# Patient Record
Sex: Female | Born: 1970 | Race: White | Hispanic: No | Marital: Married | Smoking: Never smoker
Health system: Southern US, Community
[De-identification: ages and names within clinical notes are randomized; demographics above are authoritative.]

## PROBLEM LIST (undated history)

## (undated) DIAGNOSIS — K219 Gastro-esophageal reflux disease without esophagitis: Secondary | ICD-10-CM

## (undated) DIAGNOSIS — I10 Essential (primary) hypertension: Secondary | ICD-10-CM

## (undated) HISTORY — PX: BACK SURGERY: SHX140

---

## 2020-12-22 ENCOUNTER — Encounter (HOSPITAL_COMMUNITY): Payer: Self-pay | Admitting: Emergency Medicine

## 2020-12-22 ENCOUNTER — Emergency Department (HOSPITAL_COMMUNITY): Payer: Self-pay

## 2020-12-22 ENCOUNTER — Other Ambulatory Visit: Payer: Self-pay

## 2020-12-22 ENCOUNTER — Emergency Department (HOSPITAL_COMMUNITY)
Admission: EM | Admit: 2020-12-22 | Discharge: 2020-12-22 | Disposition: A | Discharge status: Home / Self Care | Payer: Self-pay | Attending: Emergency Medicine | Admitting: Emergency Medicine

## 2020-12-22 DIAGNOSIS — K219 Gastro-esophageal reflux disease without esophagitis: Secondary | ICD-10-CM | POA: Insufficient documentation

## 2020-12-22 DIAGNOSIS — Z79899 Other long term (current) drug therapy: Secondary | ICD-10-CM | POA: Insufficient documentation

## 2020-12-22 DIAGNOSIS — I10 Essential (primary) hypertension: Secondary | ICD-10-CM | POA: Insufficient documentation

## 2020-12-22 DIAGNOSIS — R1013 Epigastric pain: Secondary | ICD-10-CM | POA: Insufficient documentation

## 2020-12-22 HISTORY — DX: Essential (primary) hypertension: I10

## 2020-12-22 HISTORY — DX: Gastro-esophageal reflux disease without esophagitis: K21.9

## 2020-12-22 LAB — CBC WITH DIFFERENTIAL/PLATELET
Abs Immature Granulocytes: 0.01 10*3/uL (ref 0.00–0.07)
Basophils Absolute: 0 10*3/uL (ref 0.0–0.1)
Basophils Relative: 0 %
Eosinophils Absolute: 0.1 10*3/uL (ref 0.0–0.5)
Eosinophils Relative: 2 %
HCT: 43.1 % (ref 36.0–46.0)
Hemoglobin: 14.3 g/dL (ref 12.0–15.0)
Immature Granulocytes: 0 %
Lymphocytes Relative: 46 %
Lymphs Abs: 2.9 10*3/uL (ref 0.7–4.0)
MCH: 32.3 pg (ref 26.0–34.0)
MCHC: 33.2 g/dL (ref 30.0–36.0)
MCV: 97.3 fL (ref 80.0–100.0)
Monocytes Absolute: 0.4 10*3/uL (ref 0.1–1.0)
Monocytes Relative: 6 %
Neutro Abs: 2.9 10*3/uL (ref 1.7–7.7)
Neutrophils Relative %: 46 %
Platelets: 296 10*3/uL (ref 150–400)
RBC: 4.43 MIL/uL (ref 3.87–5.11)
RDW: 12.1 % (ref 11.5–15.5)
WBC: 6.4 10*3/uL (ref 4.0–10.5)
nRBC: 0 % (ref 0.0–0.2)

## 2020-12-22 LAB — COMPREHENSIVE METABOLIC PANEL
ALT: 15 U/L (ref 0–44)
AST: 12 U/L — ABNORMAL LOW (ref 15–41)
Albumin: 4.4 g/dL (ref 3.5–5.0)
Alkaline Phosphatase: 63 U/L (ref 38–126)
Anion gap: 11 (ref 5–15)
BUN: 10 mg/dL (ref 6–20)
CO2: 25 mmol/L (ref 22–32)
Calcium: 9.4 mg/dL (ref 8.9–10.3)
Chloride: 103 mmol/L (ref 98–111)
Creatinine, Ser: 0.91 mg/dL (ref 0.44–1.00)
GFR, Estimated: >60 mL/min (ref >60)
Glucose, Bld: 95 mg/dL (ref 70–99)
Potassium: 4 mmol/L (ref 3.5–5.1)
Sodium: 139 mmol/L (ref 135–145)
Total Bilirubin: 1 mg/dL (ref 0.3–1.2)
Total Protein: 7.5 g/dL (ref 6.5–8.1)

## 2020-12-22 LAB — URINALYSIS, ROUTINE W REFLEX MICROSCOPIC
Bilirubin Urine: NEGATIVE
Glucose, UA: NEGATIVE mg/dL
Hgb urine dipstick: NEGATIVE
Ketones, ur: NEGATIVE mg/dL
Leukocytes,Ua: NEGATIVE
Nitrite: NEGATIVE
Protein, ur: NEGATIVE mg/dL
Specific Gravity, Urine: 1.019 (ref 1.005–1.030)
pH: 5 (ref 5.0–8.0)

## 2020-12-22 LAB — LIPASE, BLOOD: Lipase: 38 U/L (ref 11–51)

## 2020-12-22 LAB — I-STAT BETA HCG BLOOD, ED (MC, WL, AP ONLY): I-stat hCG, quantitative: <5 m[IU]/mL (ref <5)

## 2020-12-22 MED ORDER — MORPHINE SULFATE (PF) 4 MG/ML IV SOLN
4.0000 mg | Freq: Once | INTRAVENOUS | Status: AC
Start: 2020-12-22 — End: 2020-12-22
  Administered 2020-12-22: 4 mg via INTRAVENOUS
  Filled 2020-12-22: qty 1

## 2020-12-22 MED ORDER — HYDROCODONE-ACETAMINOPHEN 5-325 MG PO TABS: 2.0000 | ORAL_TABLET | ORAL | 0 refills | Status: AC | PRN

## 2020-12-22 MED ORDER — ALUM & MAG HYDROXIDE-SIMETH 200-200-20 MG/5ML PO SUSP
30.0000 mL | Freq: Once | ORAL | Status: AC
  Administered 2020-12-22: 30 mL via ORAL
  Filled 2020-12-22: qty 30

## 2020-12-22 MED ORDER — PANTOPRAZOLE SODIUM 40 MG IV SOLR
40.0000 mg | Freq: Once | INTRAVENOUS | Status: AC
  Administered 2020-12-22: 40 mg via INTRAVENOUS
  Filled 2020-12-22: qty 40

## 2020-12-22 MED ORDER — LIDOCAINE VISCOUS HCL 2 % MT SOLN
15.0000 mL | Freq: Once | OROMUCOSAL | Status: AC
  Administered 2020-12-22: 15 mL via ORAL
  Filled 2020-12-22: qty 15

## 2020-12-22 MED ORDER — PANTOPRAZOLE SODIUM 20 MG PO TBEC: 20.0000 mg | DELAYED_RELEASE_TABLET | Freq: Every day | ORAL | 0 refills | Status: AC

## 2020-12-22 MED ORDER — ONDANSETRON HCL 4 MG/2ML IJ SOLN
4.0000 mg | Freq: Once | INTRAMUSCULAR | Status: AC
  Administered 2020-12-22: 4 mg via INTRAVENOUS
  Filled 2020-12-22: qty 2

## 2020-12-22 NOTE — ED Triage Notes (Signed)
Pt from home for eval of lower abdominal pain since 0730 this morning. Denies n/v/d. Also endorses burping frequently/the taste of eggs over the last few days, which recurred again today.

## 2020-12-22 NOTE — ED Provider Notes (Signed)
[facility] EMERGENCY DEPARTMENT Provider Note   CSN: [id number] Arrival date & time: 12/22/20  0941     History Chief Complaint  Patient presents with  . Abdominal Pain    Jaclyn [name] is a 50 y.o. female.  Patient is a 50 year old female who presents with abdominal pain.  She has a history of hypertension and GERD.  She said last night she started having some increase in her heartburn symptoms.  She then started having some intense pain this morning it started suddenly at 730.  She says it is all across her abdomen.  She cannot really localize it.  There is no associated back pain.  She denies any nausea or vomiting.  No change in her bowels.  No urinary symptoms.  No known fevers.  No prior abdominal surgeries.  No known history of kidney stones.        Past Medical History:  Diagnosis Date  . GERD (gastroesophageal reflux disease)   . Hypertension     There are no problems to display for this patient.    The histories are not reviewed yet. Please review them in the "History" navigator section and refresh this SmartLink.   OB History   No obstetric history on file.     No family history on file.     Home Medications Prior to Admission medications   Medication Sig Start Date End Date Taking? Authorizing Provider  amphetamine-dextroamphetamine (ADDERALL XR) 30 MG 24 hr capsule Take 30 mg by mouth every morning.   Yes [provider]  amphetamine-dextroamphetamine (ADDERALL) 10 MG tablet Take 15 mg by mouth every evening.   Yes [provider]  HYDROcodone-acetaminophen (NORCO/VICODIN) 5-325 MG tablet Take 2 tablets by mouth every 4 (four) hours as needed. 12/22/20  Yes Rolan Bucco, MD  liraglutide (VICTOZA) 18 MG/3ML SOPN Inject 0.6 mg into the skin daily.   Yes [provider]  losartan (COZAAR) 50 MG tablet Take 100 mg by mouth daily.   Yes [provider]  metoprolol (TOPROL-XL) 200 MG 24 hr tablet Take 200  mg by mouth daily.   Yes [provider]  omeprazole (PRILOSEC) 20 MG capsule Take 20 mg by mouth as needed (heartburn).   Yes [provider]  pantoprazole (PROTONIX) 20 MG tablet Take 1 tablet (20 mg total) by mouth daily. 12/22/20  Yes Rolan Bucco, MD    Allergies    Vicodin hp [hydrocodone-acetaminophen]  Review of Systems   Review of Systems  Constitutional: Negative for chills, diaphoresis, fatigue and fever.  HENT: Negative for congestion, rhinorrhea and sneezing.   Eyes: Negative.   Respiratory: Negative for cough, chest tightness and shortness of breath.   Cardiovascular: Negative for chest pain and leg swelling.  Gastrointestinal: Positive for abdominal pain. Negative for blood in stool, diarrhea, nausea and vomiting.  Genitourinary: Negative for difficulty urinating, flank pain, frequency and hematuria.  Musculoskeletal: Negative for arthralgias and back pain.  Skin: Negative for rash.  Neurological: Negative for dizziness, speech difficulty, weakness, numbness and headaches.    Physical Exam Updated Vital Signs BP 132/84   Pulse (!) 58   Temp 98.6 F (37 C) (Oral)   Resp 17   Ht 5\' 4"  (1.626 m)   Wt 85.3 kg   SpO2 96%   BMI 32.27 kg/m   Physical Exam Constitutional:      General: She is in acute distress.     Appearance: She is well-developed.     Comments: Appears uncomfortable,  is rocking around on the bed  HENT:     Head: Normocephalic and atraumatic.  Eyes:     Pupils: Pupils are equal, round, and reactive to light.  Cardiovascular:     Rate and Rhythm: Normal rate and regular rhythm.     Heart sounds: Normal heart sounds.  Pulmonary:     Effort: Pulmonary effort is normal. No respiratory distress.     Breath sounds: Normal breath sounds. No wheezing or rales.  Chest:     Chest wall: No tenderness.  Abdominal:     General: Bowel sounds are normal.     Palpations: Abdomen is soft.     Tenderness: There is generalized abdominal  tenderness. There is no guarding or rebound.  Musculoskeletal:        General: Normal range of motion.     Cervical back: Normal range of motion and neck supple.  Lymphadenopathy:     Cervical: No cervical adenopathy.  Skin:    General: Skin is warm and dry.     Findings: No rash.  Neurological:     Mental Status: She is alert and oriented to person, place, and time.     ED Results / Procedures / Treatments   Labs (all labs ordered are listed, but only abnormal results are displayed) Labs Reviewed  COMPREHENSIVE METABOLIC PANEL - Abnormal; Notable for the following components:      Result Value   AST 12 (*)    All other components within normal limits  URINALYSIS, ROUTINE W REFLEX MICROSCOPIC - Abnormal; Notable for the following components:   APPearance HAZY (*)    All other components within normal limits  CBC WITH DIFFERENTIAL/PLATELET  LIPASE, BLOOD  I-STAT BETA HCG BLOOD, ED ([facility], [facility], [facility] ONLY)    EKG None  Radiology CT Renal Stone Study  Result Date: 12/22/2020 CLINICAL DATA:  Suprapubic pain, flank pain EXAM: CT ABDOMEN AND PELVIS WITHOUT CONTRAST TECHNIQUE: Multidetector CT imaging of the abdomen and pelvis was performed following the standard protocol without IV contrast. COMPARISON:  None. FINDINGS: Lower chest: Lung bases are clear. No effusions. Heart is normal size. Hepatobiliary: No focal hepatic abnormality. Gallbladder unremarkable. Pancreas: No focal abnormality or ductal dilatation. Spleen: No focal abnormality.  Normal size. Adrenals/Urinary Tract: No adrenal abnormality. No focal renal abnormality. No stones or hydronephrosis. Urinary bladder is unremarkable. Stomach/Bowel: Normal appendix. Stomach, large and small bowel grossly unremarkable. Vascular/Lymphatic: No evidence of aneurysm or adenopathy. Reproductive: Uterus and adnexa unremarkable.  No mass. Other: No free fluid or free air. Musculoskeletal: No acute bony abnormality. IMPRESSION: No renal or  ureteral stones.  No hydronephrosis. Normal appendix. No acute findings in the abdomen or pelvis. Electronically Signed   By: Charlett Nose M.D.   On: 12/22/2020 11:39    Procedures Procedures   Medications Ordered in ED Medications  morphine 4 MG/ML injection 4 mg (4 mg Intravenous Given 12/22/20 1013)  ondansetron (ZOFRAN) injection 4 mg (4 mg Intravenous Given 12/22/20 1012)  morphine 4 MG/ML injection 4 mg (4 mg Intravenous Given 12/22/20 1159)  pantoprazole (PROTONIX) injection 40 mg (40 mg Intravenous Given 12/22/20 1207)  alum & mag hydroxide-simeth (MAALOX/MYLANTA) 200-200-20 MG/5ML suspension 30 mL (30 mLs Oral Given 12/22/20 1206)    And  lidocaine (XYLOCAINE) 2 % viscous mouth solution 15 mL (15 mLs Oral Given 12/22/20 1206)    ED Course  I have reviewed the triage vital signs and the nursing notes.  Pertinent labs & imaging results that were available during  my care of the patient were reviewed by me and considered in my medical decision making (see chart for details).    MDM Rules/Calculators/A&P                          Patient is a 50 year old female who presents with abdominal pain.  She had no associated symptoms.  She had a CT scan which shows no acute abnormality.  She was given pain medication and a GI cocktail.  She was also given Protonix.  Her labs are nonconcerning.  Her lipase is normal.  There is no elevation in her LFTs.  No evidence of gallbladder disease.  No kidney stones.  This could represent some bad gastritis versus peptic ulcer disease.  Her repeat abdominal exam is improved.  She was discharged home in good condition.  She was given a prescription for Protonix and a small amount of pain medicine.  She was given a referral to follow-up with gastroenterology if her symptoms are not improving.  Return precautions were given.  She was also advised to use a bland diet. Final Clinical Impression(s) / ED Diagnoses Final diagnoses:  Epigastric pain    Rx / DC  Orders ED Discharge Orders         Ordered    pantoprazole (PROTONIX) 20 MG tablet  Daily        12/22/20 1434    HYDROcodone-acetaminophen (NORCO/VICODIN) 5-325 MG tablet  Every 4 hours PRN        12/22/20 1434           Rolan Bucco, MD 12/22/20 1438

## 2020-12-22 NOTE — Discharge Instructions (Signed)
Make an appointment with your primary care doctor or the gastroenterologist listed above for a follow-up appointment.  Eat a bland diet.  Return here as needed if you have any worsening symptoms.

## 2020-12-22 NOTE — ED Notes (Signed)
Patient transported to CT 

## 2022-05-09 IMAGING — CT CT RENAL STONE PROTOCOL
2 of 4 series · 17 of 46 positions shown, 19 images · non-contrast
Comparison: None.

CLINICAL DATA: Suprapubic pain, flank pain

EXAM:
CT ABDOMEN AND PELVIS WITHOUT CONTRAST
TECHNIQUE: Multidetector CT imaging of the abdomen and pelvis was performed
following the standard protocol without IV contrast.

[Series 3: stone study 5.0 i30f 2 · axial · 0.79mm/px · z∈[+1033,+1503]mm · 14 of 104 slices shown, 16 images]
[im 5/104  soft-tissue]
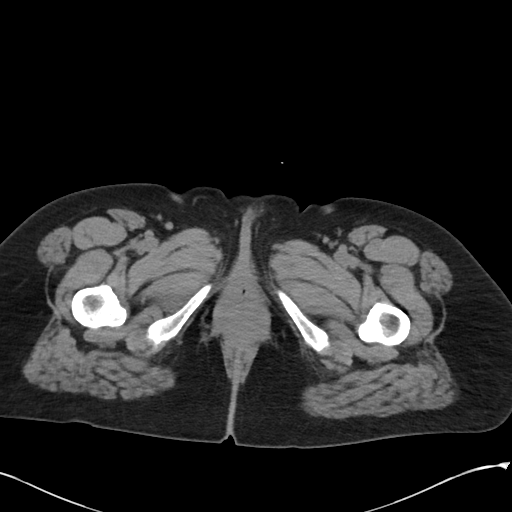
[im 5/104  bone]
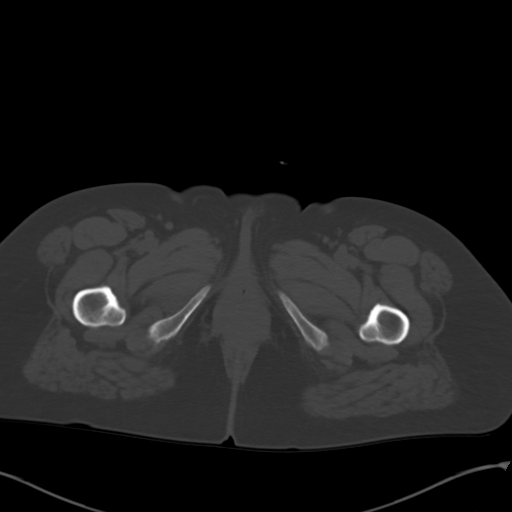
[im 13/104  soft-tissue]
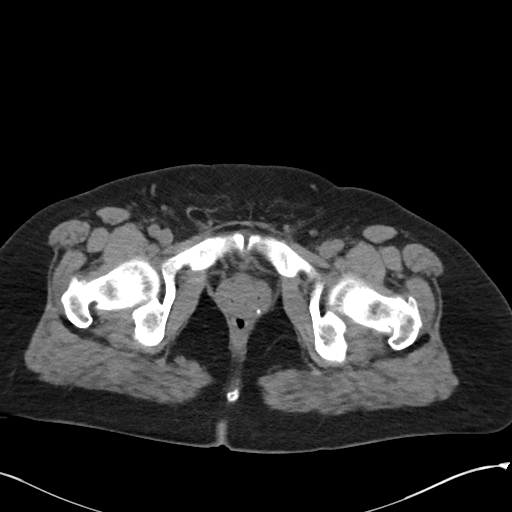
[im 22/104  soft-tissue]
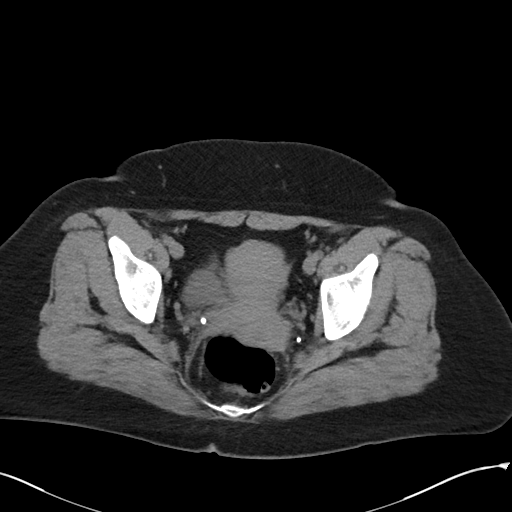
[im 26/104  soft-tissue]
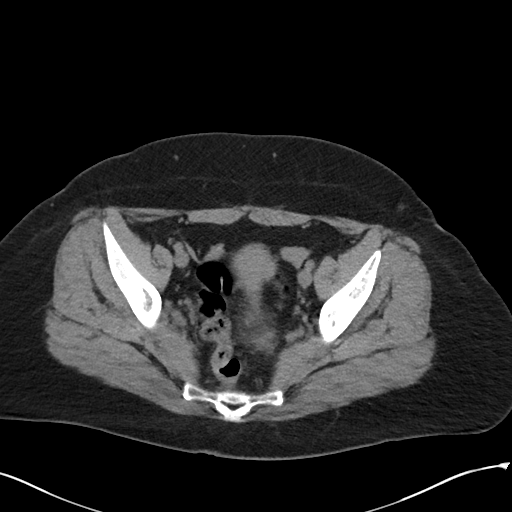
[im 35/104  soft-tissue]
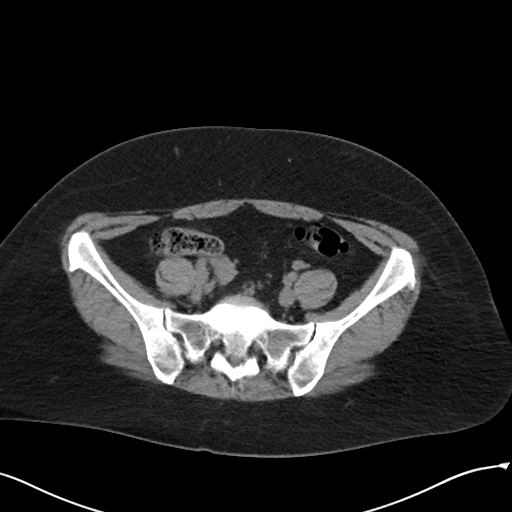
[im 43/104  soft-tissue]
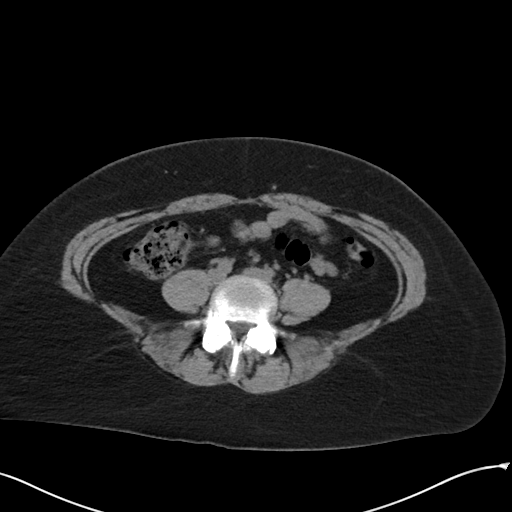
[im 48/104  soft-tissue]
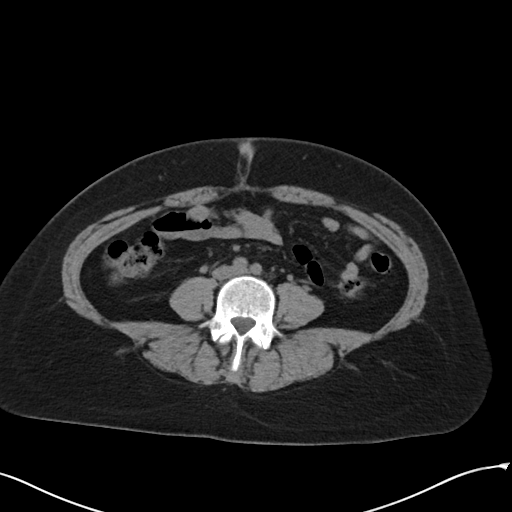
[im 56/104  soft-tissue]
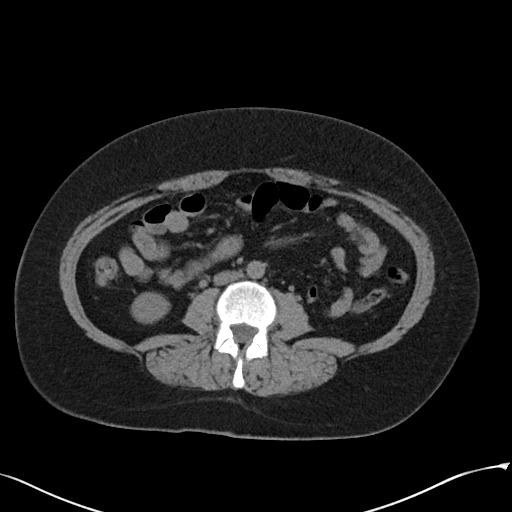
[im 61/104  soft-tissue]
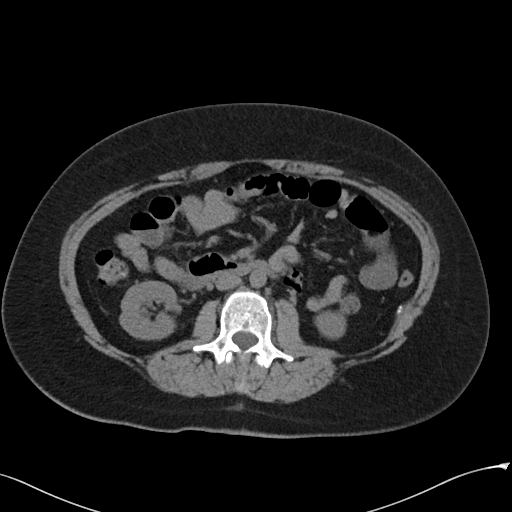
[im 61/104  bone]
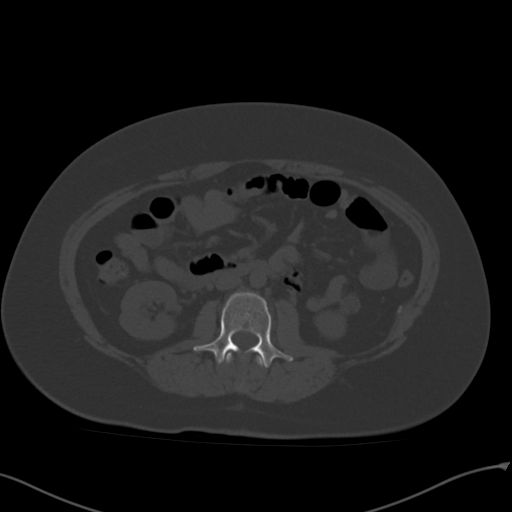
[im 69/104  soft-tissue]
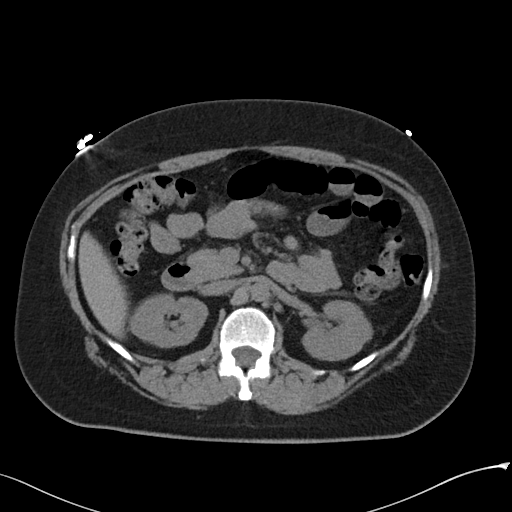
[im 78/104  soft-tissue]
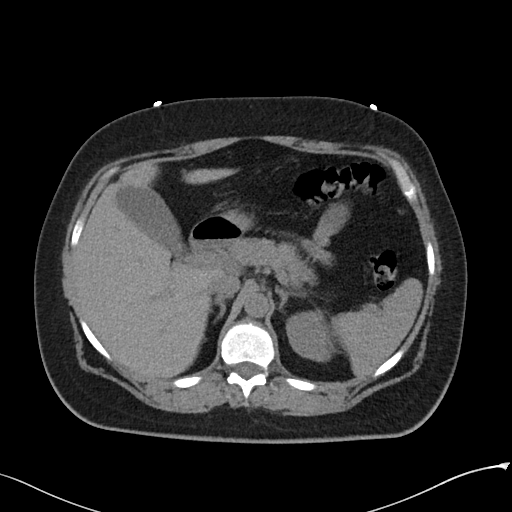
[im 82/104  soft-tissue]
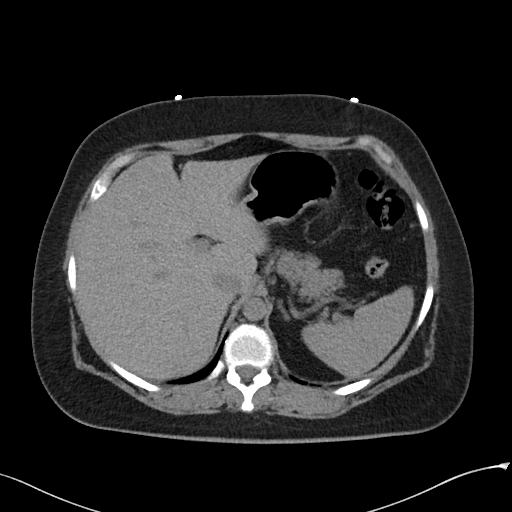
[im 91/104  soft-tissue]
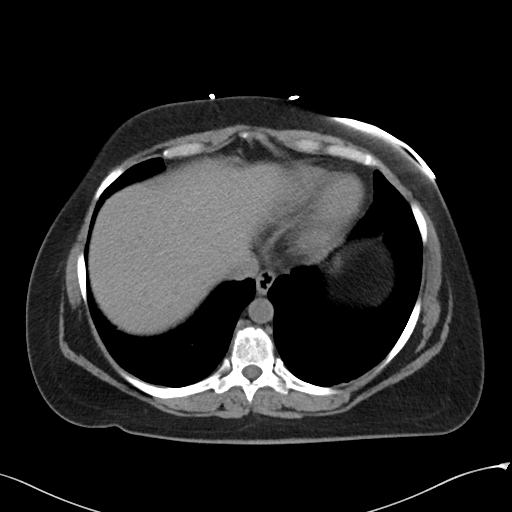
[im 99/104  soft-tissue]
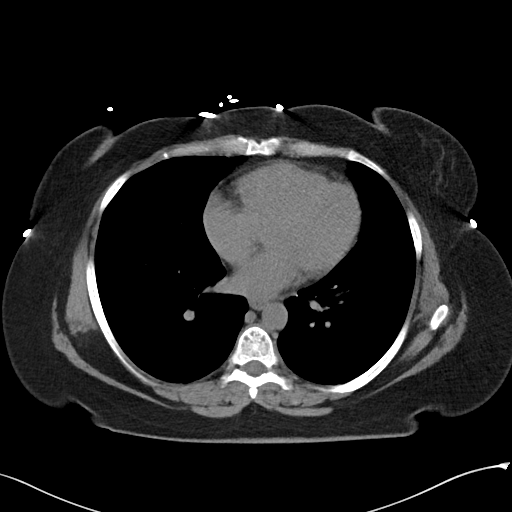

[Series 6: coronal soft tissue · coronal · 0.93mm/px · 3 of 100 slices shown]
[im 34/100  soft-tissue]
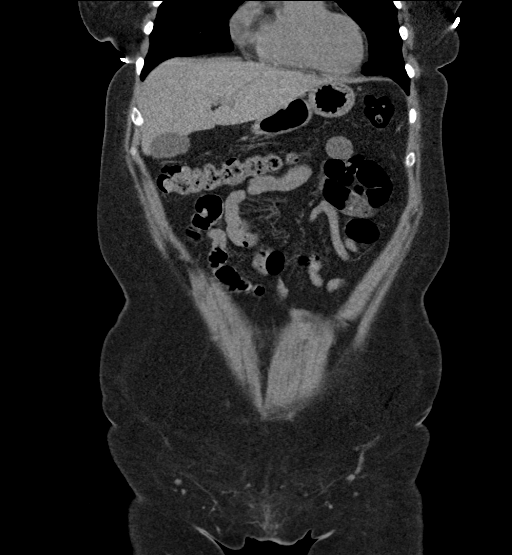
[im 45/100  soft-tissue]
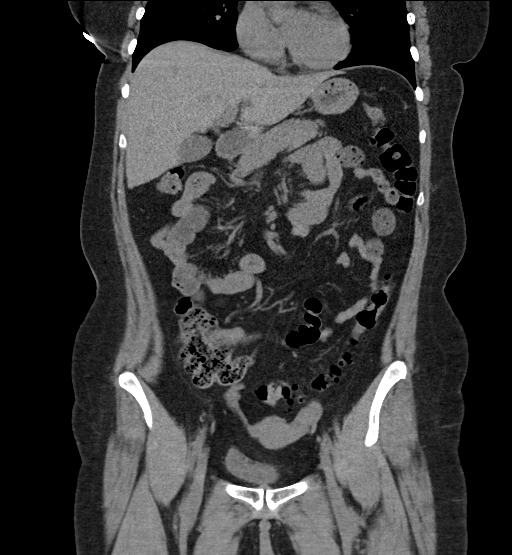
[im 56/100  soft-tissue]
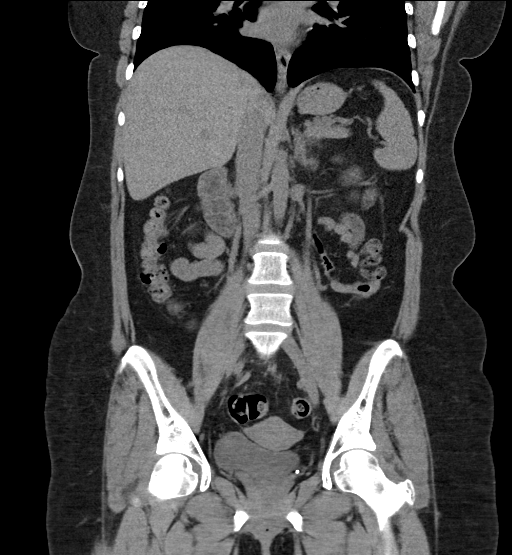

[17 of 46 positions shown; findings below may reference images not displayed]

FINDINGS: Lower chest: Lung bases are clear. No effusions. Heart is normal
size.

Hepatobiliary: No focal hepatic abnormality. Gallbladder
unremarkable.

Pancreas: No focal abnormality or ductal dilatation.

Spleen: No focal abnormality.  Normal size.

Adrenals/Urinary Tract: No adrenal abnormality. No focal renal
abnormality. No stones or hydronephrosis. Urinary bladder is
unremarkable.

Stomach/Bowel: Normal appendix. Stomach, large and small bowel
grossly unremarkable.

Vascular/Lymphatic: No evidence of aneurysm or adenopathy.

Reproductive: Uterus and adnexa unremarkable.  No mass.

Other: No free fluid or free air.

Musculoskeletal: No acute bony abnormality.
IMPRESSION: No renal or ureteral stones.  No hydronephrosis.

Normal appendix.

No acute findings in the abdomen or pelvis.

## 2022-10-04 ENCOUNTER — Encounter (HOSPITAL_COMMUNITY): Payer: Self-pay

## 2022-10-04 ENCOUNTER — Emergency Department (HOSPITAL_COMMUNITY)
Admission: EM | Admit: 2022-10-04 | Discharge: 2022-10-04 | Disposition: A | Discharge status: Home / Self Care | Payer: Self-pay | Attending: Emergency Medicine | Admitting: Emergency Medicine

## 2022-10-04 ENCOUNTER — Other Ambulatory Visit: Payer: Self-pay

## 2022-10-04 ENCOUNTER — Emergency Department (HOSPITAL_COMMUNITY): Payer: Self-pay

## 2022-10-04 DIAGNOSIS — S93402A Sprain of unspecified ligament of left ankle, initial encounter: Secondary | ICD-10-CM | POA: Insufficient documentation

## 2022-10-04 DIAGNOSIS — W109XXA Fall (on) (from) unspecified stairs and steps, initial encounter: Secondary | ICD-10-CM | POA: Insufficient documentation

## 2022-10-04 DIAGNOSIS — M25562 Pain in left knee: Secondary | ICD-10-CM | POA: Insufficient documentation

## 2022-10-04 MED ORDER — ACETAMINOPHEN 500 MG PO TABS
1000.0000 mg | ORAL_TABLET | Freq: Once | ORAL | Status: AC
  Administered 2022-10-04: 1000 mg via ORAL
  Filled 2022-10-04: qty 2

## 2022-10-04 NOTE — ED Provider Notes (Signed)
[facility] Provider Note   CSN: [id number] Arrival date & time: 10/04/22  1336     History  Chief Complaint  Patient presents with   Fall   Ankle Pain   Knee Pain    Shalaine Payson is a 52 y.o. female.  Patient here with left ankle pain after fall last night.  Been able to ambulate but with a lot of pain.  Denies any chest pain or shortness of breath.  Did not hit her head or lose consciousness.  Not on blood thinners.  Pain mostly in the left ankle.  May be some discomfort in the left knee.  No weakness, numbness, tingling.  The history is provided by the patient.       Home Medications Prior to Admission medications   Medication Sig Start Date End Date Taking? Authorizing Provider  amphetamine-dextroamphetamine (ADDERALL XR) 30 MG 24 hr capsule Take 30 mg by mouth every morning.    [provider]  amphetamine-dextroamphetamine (ADDERALL) 10 MG tablet Take 15 mg by mouth every evening.    [provider]  HYDROcodone-acetaminophen (NORCO/VICODIN) 5-325 MG tablet Take 2 tablets by mouth every 4 (four) hours as needed. 12/22/20   Malvin Johns, MD  liraglutide (VICTOZA) 18 MG/3ML SOPN Inject 0.6 mg into the skin daily.    [provider]  losartan (COZAAR) 50 MG tablet Take 100 mg by mouth daily.    [provider]  metoprolol (TOPROL-XL) 200 MG 24 hr tablet Take 200 mg by mouth daily.    [provider]  omeprazole (PRILOSEC) 20 MG capsule Take 20 mg by mouth as needed (heartburn).    [provider]  pantoprazole (PROTONIX) 20 MG tablet Take 1 tablet (20 mg total) by mouth daily. 12/22/20   Malvin Johns, MD      Allergies    Vicodin hp [hydrocodone-acetaminophen]    Review of Systems   Review of Systems  Physical Exam Updated Vital Signs BP (!) 132/90 (BP Location: Right Arm)   Pulse 79   Temp 97.9 F (36.6 C) (Oral)   Resp 16   Wt 85 kg   SpO2 98%   BMI 32.17  kg/m  Physical Exam Vitals and nursing note reviewed.  Constitutional:      General: She is not in acute distress.    Appearance: She is well-developed.  HENT:     Head: Normocephalic and atraumatic.     Mouth/Throat:     Mouth: Mucous membranes are moist.  Eyes:     Extraocular Movements: Extraocular movements intact.     Conjunctiva/sclera: Conjunctivae normal.     Pupils: Pupils are equal, round, and reactive to light.  Cardiovascular:     Rate and Rhythm: Normal rate and regular rhythm.     Pulses: Normal pulses.     Heart sounds: Normal heart sounds. No murmur heard. Pulmonary:     Effort: Pulmonary effort is normal. No respiratory distress.     Breath sounds: Normal breath sounds.  Abdominal:     Palpations: Abdomen is soft.     Tenderness: There is no abdominal tenderness.  Musculoskeletal:        General: Swelling and tenderness present. No deformity.     Cervical back: Neck supple.     Comments: Bruising and swelling to the left ankle but no obvious deformity  Skin:    General: Skin is warm and dry.     Capillary Refill: Capillary refill takes less  than 2 seconds.     Findings: Bruising present.  Neurological:     General: No focal deficit present.     Mental Status: She is alert.     Sensory: No sensory deficit.     Motor: No weakness.  Psychiatric:        Mood and Affect: Mood normal.     ED Results / Procedures / Treatments   Labs (all labs ordered are listed, but only abnormal results are displayed) Labs Reviewed - No data to display  EKG None  Radiology DG Ankle Complete Left  Result Date: 10/04/2022 CLINICAL DATA:  Fall down outside steps onto concrete last night. EXAM: LEFT ANKLE COMPLETE - 3+ VIEW; LEFT FOOT - COMPLETE 3+ VIEW COMPARISON:  None Available. FINDINGS: Left ankle: Moderate lateral and anterior soft tissue swelling. The ankle mortise is symmetric and intact. Joint space is preserved. Small plantar calcaneal heel spur. No acute  fracture is seen. No dislocation. Left foot: Mild hallux valgus. Mild lateral great toe metatarsophalangeal joint space narrowing and peripheral spurring. No acute fracture is seen. No dislocation. IMPRESSION: 1. Moderate lateral and anterior ankle soft tissue swelling. 2. No acute fracture is seen within the left ankle or left foot. 3. 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 Electronically Signed   By: Yvonne Kendall M.D.   On: 10/04/2022 14:52   DG Foot Complete Left  Result Date: 10/04/2022 CLINICAL DATA:  Fall down outside steps onto concrete last night. EXAM: LEFT ANKLE COMPLETE - 3+ VIEW; LEFT FOOT - COMPLETE 3+ VIEW COMPARISON:  None Available. FINDINGS: Left ankle: Moderate lateral and anterior soft tissue swelling. The ankle mortise is symmetric and intact. Joint space is preserved. Small plantar calcaneal heel spur. No acute fracture is seen. No dislocation. Left foot: Mild hallux valgus. Mild lateral great toe metatarsophalangeal joint space narrowing and peripheral spurring. No acute fracture is seen. No dislocation. IMPRESSION: 1. Moderate lateral and anterior ankle soft tissue swelling. 2. No acute fracture is seen within the left ankle or left foot. 3. 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 Electronically Signed   By: Yvonne Kendall M.D.   On: 10/04/2022 14:52   DG Knee Complete 4 Views Left  Result Date: 10/04/2022 CLINICAL DATA:  Fall down on outside steps onto concrete last night. Left knee, lateral leg, and ankle pain. EXAM: LEFT KNEE - COMPLETE 4+ VIEW COMPARISON:  None Available. FINDINGS: Normal bone mineralization. Joint spaces are preserved. There are two chronic appearing oval calcific densities measuring up to approximately 5 mm at the anterior lateral aspect of the lateral knee compartment. No joint effusion. No acute fracture or dislocation. IMPRESSION: There are two chronic appearing oval calcific densities  measuring up to approximately 5 mm at the anterior lateral aspect of the lateral knee compartment. These may represent loose bodies. Otherwise, no significant joint space narrowing. No acute fracture. Electronically Signed   By: Yvonne Kendall M.D.   On: 10/04/2022 14:49    Procedures Procedures    Medications Ordered in ED Medications  acetaminophen (TYLENOL) tablet 1,000 mg (has no administration in time range)    ED Course/ Medical Decision Making/ A&P                             Medical Decision Making Risk OTC drugs.   Lekita Kerekes is here with left ankle pain after fall last night.  Normal vitals.  No fever.  No significant comorbidities.  Neurovascular neuromuscular intact.  Significant bruising and swelling to the left ankle.  Differential diagnosis likely sprain versus fracture.  Will get an x-ray of the left knee, left ankle and foot.  X-rays per my review and interpretation show no fracture.  Overall suspect ankle sprain.  Will put her in a cam walking boot, provided crutches.  Recommend Tylenol and ice and rest.  Will have her follow-up with orthopedics.  Discharged in good condition.  This chart was dictated using voice recognition software.  Despite best efforts to proofread,  errors can occur which can change the documentation meaning.         Final Clinical Impression(s) / ED Diagnoses Final diagnoses:  Sprain of left ankle, unspecified ligament, initial encounter    Rx / DC Orders ED Discharge Orders     None         Virgina Norfolk, DO 10/04/22 1753

## 2022-10-04 NOTE — ED Triage Notes (Signed)
Pt reports falling down outside steps onto concrete last night. Left ankle and left knee pain  Abrasion noted to right leg.

## 2022-10-04 NOTE — Discharge Instructions (Signed)
Recommend 1000 mg of Tylenol every 6 hours as needed for pain.  Recommend ice.  Wear walking boot for comfort.  Activity as tolerated.  Follow-up with orthopedics.

## 2022-10-10 ENCOUNTER — Emergency Department (HOSPITAL_BASED_OUTPATIENT_CLINIC_OR_DEPARTMENT_OTHER): Payer: Medicaid Other

## 2022-10-10 ENCOUNTER — Encounter (HOSPITAL_BASED_OUTPATIENT_CLINIC_OR_DEPARTMENT_OTHER): Payer: Self-pay | Admitting: Emergency Medicine

## 2022-10-10 ENCOUNTER — Other Ambulatory Visit: Payer: Self-pay

## 2022-10-10 ENCOUNTER — Emergency Department (HOSPITAL_BASED_OUTPATIENT_CLINIC_OR_DEPARTMENT_OTHER)
Admission: EM | Admit: 2022-10-10 | Discharge: 2022-10-10 | Disposition: A | Discharge status: Home / Self Care | Payer: Medicaid Other | Attending: Emergency Medicine | Admitting: Emergency Medicine

## 2022-10-10 DIAGNOSIS — S93402D Sprain of unspecified ligament of left ankle, subsequent encounter: Secondary | ICD-10-CM

## 2022-10-10 DIAGNOSIS — W19XXXA Unspecified fall, initial encounter: Secondary | ICD-10-CM | POA: Insufficient documentation

## 2022-10-10 MED ORDER — OXYCODONE-ACETAMINOPHEN 5-325 MG PO TABS
1.0000 | ORAL_TABLET | Freq: Once | ORAL | Status: AC
  Administered 2022-10-10: 1 via ORAL
  Filled 2022-10-10: qty 1

## 2022-10-10 NOTE — Discharge Instructions (Addendum)
Please use crutches to take weight off your ankle.  There is no signs of fracture, per x-ray, please use the Aircast to help with stability, you may take it off at night.  Additionally after you get off your crutches please wrap your ankle for the next 2 weeks during the day, to help promote stability as you are more likely to get another ankle sprain after your series ankle sprain.

## 2022-10-10 NOTE — ED Triage Notes (Addendum)
Pt fell 1 wk ago; c/o continued LT ankle pain; was seen at [facility] for same, but sts she cannot walk on it now; sts she was having trouble wearing boot because it was rubbing her ankle and is not able to f/u with ortho d/t no insurance

## 2022-10-10 NOTE — ED Provider Notes (Signed)
[facility] Provider Note   CSN: [id number] Arrival date & time: 10/10/22  1315     History  Chief Complaint  Patient presents with   Ankle Pain    Jaclyn [name] is a 52 y.o. female, no pertinent past medical history, who presents to the ED secondary to left ankle swelling, pain since falling about a week ago.  She states that she was seen in the ER in [place], told she had an ankle sprain, and to follow-up with orthopedics, since then she has had severe pain to her ankle, and has had difficulty walking on it.  She has been using a walking boot, but states that rubs too much.  States that she cannot get relief of her symptoms, and has taken Tylenol without relief.  Denies any numbness, tingling to the foot.    Home Medications Prior to Admission medications   Medication Sig Start Date End Date Taking? Authorizing Provider  amphetamine-dextroamphetamine (ADDERALL XR) 30 MG 24 hr capsule Take 30 mg by mouth every morning.    [provider]  amphetamine-dextroamphetamine (ADDERALL) 10 MG tablet Take 15 mg by mouth every evening.    [provider]  HYDROcodone-acetaminophen (NORCO/VICODIN) 5-325 MG tablet Take 2 tablets by mouth every 4 (four) hours as needed. 12/22/20   Malvin Johns, MD  liraglutide (VICTOZA) 18 MG/3ML SOPN Inject 0.6 mg into the skin daily.    [provider]  losartan (COZAAR) 50 MG tablet Take 100 mg by mouth daily.    [provider]  metoprolol (TOPROL-XL) 200 MG 24 hr tablet Take 200 mg by mouth daily.    [provider]  omeprazole (PRILOSEC) 20 MG capsule Take 20 mg by mouth as needed (heartburn).    [provider]  pantoprazole (PROTONIX) 20 MG tablet Take 1 tablet (20 mg total) by mouth daily. 12/22/20   Malvin Johns, MD      Allergies    Nsaids and Vicodin hp [hydrocodone-acetaminophen]    Review of Systems   Review of Systems  Musculoskeletal:   Positive for joint swelling.  Skin:  Negative for rash.    Physical Exam Updated Vital Signs BP 138/88 (BP Location: Right Arm)   Pulse 75   Temp 98.2 F (36.8 C) (Oral)   Resp 18   Ht 5\' 4"  (1.626 m)   Wt 94.3 kg   LMP 09/09/2022   SpO2 97%   BMI 35.70 kg/m  Physical Exam Vitals and nursing note reviewed.  Constitutional:      General: She is not in acute distress.    Appearance: She is well-developed.  HENT:     Head: Normocephalic and atraumatic.  Eyes:     General:        Right eye: No discharge.        Left eye: No discharge.     Conjunctiva/sclera: Conjunctivae normal.  Cardiovascular:     Pulses: Normal pulses.  Pulmonary:     Effort: No respiratory distress.  Musculoskeletal:     Comments: Left ankle: TTP of both lateral and medial malleolus. Edema noted to ankle. Not able to bear weight. Able to plantar flex and dorsiflex ankle. Inversion/eversion intact. Negative Thompson test. No midfoot or base of 5th metatarsal tenderness to palpation. Capillary refill <2sec. Dorsalis pedis pulse present. No foot drop noted. Sensation intact. Warm to touch.    Neurological:     Mental Status: She is alert.     Sensory: No sensory  deficit.     Comments: Clear speech.   Psychiatric:        Behavior: Behavior normal.        Thought Content: Thought content normal.     ED Results / Procedures / Treatments   Labs (all labs ordered are listed, but only abnormal results are displayed) Labs Reviewed - No data to display  EKG None  Radiology DG Ankle Complete Left  Result Date: 10/10/2022 CLINICAL DATA:  Left ankle pain and swelling EXAM: LEFT ANKLE COMPLETE - 3+ VIEW COMPARISON:  10/04/2022 FINDINGS: There is no evidence of fracture, dislocation, or joint effusion. There is no evidence of arthropathy or other focal bone abnormality. Improving soft tissue swelling. IMPRESSION: Improving soft tissue swelling. No acute fracture or dislocation. Electronically Signed   By:  Davina Poke D.O.   On: 10/10/2022 14:06    Procedures Procedures    Medications Ordered in ED Medications  oxyCODONE-acetaminophen (PERCOCET/ROXICET) 5-325 MG per tablet 1 tablet (has no administration in time range)    ED Course/ Medical Decision Making/ A&P Clinical Course as of 10/10/22 1549  Sun Oct 10, 2022  1547 DG Ankle Complete Left [BS]    Clinical Course User Index [BS] Ethelmae Ringel, Si Gaul, PA                             Medical Decision Making Patient is a 52 year old female, here for an ankle sprain after falling about a week ago, was seen in ER, and states she cannot walk on it.  Is having difficulty walking boot, has not follow-up with orthopedics.  Will repeat x-ray just to ensure that there is no fracture that we acutely did not notice.  Give her Percocet for pain control.  Amount and/or Complexity of Data Reviewed Radiology: ordered. Decision-making details documented in ED Course.    Details: Unremarkable improved soft tissue swelling. Discussion of management or test interpretation with external provider(s): Discussed with patient, improved soft tissue swelling, likely secondary to bad bruising, we discussed the importance of using crutches, crutches provided, and gave her an Aircast to help with the stability.  We discussed return precautions, and need for follow-up with PCP.  Her her dorsalis pedis pulses were normal, and strong, and she had no neurodeficits.  Was able to flex extend invert and evert the ankle appropriately.  Risk Prescription drug management.   Final Clinical Impression(s) / ED Diagnoses Final diagnoses:  Sprain of left ankle, unspecified ligament, subsequent encounter    Rx / DC Orders ED Discharge Orders     None         Osvaldo Shipper, PA 10/10/22 1552    Tegeler, Gwenyth Allegra, MD 10/10/22 1743

## 2022-10-10 NOTE — ED Notes (Signed)
PT could not tolerate air cast, Ace wrap ok'd by EDP instead.

## 2023-08-02 ENCOUNTER — Emergency Department (HOSPITAL_COMMUNITY)
Admission: EM | Admit: 2023-08-02 | Discharge: 2023-08-02 | Disposition: A | Discharge status: Home / Self Care | Payer: Self-pay | Attending: Emergency Medicine | Admitting: Emergency Medicine

## 2023-08-02 ENCOUNTER — Encounter (HOSPITAL_COMMUNITY): Payer: Self-pay | Admitting: Emergency Medicine

## 2023-08-02 ENCOUNTER — Other Ambulatory Visit: Payer: Self-pay

## 2023-08-02 ENCOUNTER — Emergency Department (HOSPITAL_COMMUNITY): Payer: Self-pay

## 2023-08-02 DIAGNOSIS — I10 Essential (primary) hypertension: Secondary | ICD-10-CM | POA: Insufficient documentation

## 2023-08-02 DIAGNOSIS — R079 Chest pain, unspecified: Secondary | ICD-10-CM | POA: Insufficient documentation

## 2023-08-02 LAB — TROPONIN I (HIGH SENSITIVITY)
Troponin I (High Sensitivity): 3 ng/L (ref <18)
Troponin I (High Sensitivity): 3 ng/L (ref <18)

## 2023-08-02 LAB — HCG, SERUM, QUALITATIVE: Preg, Serum: NEGATIVE

## 2023-08-02 LAB — CBC
HCT: 43.3 % (ref 36.0–46.0)
Hemoglobin: 14.5 g/dL (ref 12.0–15.0)
MCH: 32.4 pg (ref 26.0–34.0)
MCHC: 33.5 g/dL (ref 30.0–36.0)
MCV: 96.9 fL (ref 80.0–100.0)
Platelets: 290 10*3/uL (ref 150–400)
RBC: 4.47 MIL/uL (ref 3.87–5.11)
RDW: 12.3 % (ref 11.5–15.5)
WBC: 8.6 10*3/uL (ref 4.0–10.5)
nRBC: 0 % (ref 0.0–0.2)

## 2023-08-02 LAB — BASIC METABOLIC PANEL
Anion gap: 10 (ref 5–15)
BUN: 14 mg/dL (ref 6–20)
CO2: 27 mmol/L (ref 22–32)
Calcium: 9.6 mg/dL (ref 8.9–10.3)
Chloride: 103 mmol/L (ref 98–111)
Creatinine, Ser: 0.77 mg/dL (ref 0.44–1.00)
GFR, Estimated: >60 mL/min (ref >60)
Glucose, Bld: 96 mg/dL (ref 70–99)
Potassium: 3.9 mmol/L (ref 3.5–5.1)
Sodium: 140 mmol/L (ref 135–145)

## 2023-08-02 MED ORDER — CYCLOBENZAPRINE HCL 10 MG PO TABS
10.0000 mg | ORAL_TABLET | Freq: Two times a day (BID) | ORAL | 0 refills | Status: AC | PRN
Start: 2023-08-02 — End: ?

## 2023-08-02 MED ORDER — ALUM & MAG HYDROXIDE-SIMETH 200-200-20 MG/5ML PO SUSP
30.0000 mL | Freq: Once | ORAL | Status: AC
  Administered 2023-08-02: 30 mL via ORAL
  Filled 2023-08-02: qty 30

## 2023-08-02 MED ORDER — ONDANSETRON HCL 4 MG/2ML IJ SOLN
4.0000 mg | Freq: Once | INTRAMUSCULAR | Status: AC
  Administered 2023-08-02: 4 mg via INTRAVENOUS
  Filled 2023-08-02: qty 2

## 2023-08-02 MED ORDER — MORPHINE SULFATE (PF) 4 MG/ML IV SOLN
4.0000 mg | Freq: Once | INTRAVENOUS | Status: AC
  Administered 2023-08-02: 4 mg via INTRAVENOUS
  Filled 2023-08-02: qty 1

## 2023-08-02 NOTE — ED Triage Notes (Signed)
Patient arrives in wheelchair by POV c/o acid reflux over the past few days, today while at work having intermittent heart racing and having a pain in left arm radiating up into her neck. C/o nausea and diarrhea. States she has HTN but does not take her meds.

## 2023-08-02 NOTE — Discharge Instructions (Addendum)
It was a pleasure taking part in your care.  As we discussed, I believe that the cause of your left arm pain and chest wall pain is musculoskeletal.  Please begin taking Tylenol for 6 hours as needed as well as Flexeril which is a muscle relaxer 2 times a day as needed.  Please follow-up with your PCP for further management care.  Return to the ED with any new or worsening symptoms.  Read the attached guide concerning chest wall pain.

## 2023-08-02 NOTE — ED Provider Notes (Signed)
[place] [facility] AT [facility] Provider Note   CSN: [id number] Arrival date & time: 08/02/23  1812     History  Chief Complaint  Patient presents with   Chest Pain    Jaclyn [name] is a 52 y.o. female with medical history of GERD and hypertension.  The patient presents to the ED for evaluation of chest pain and left arm pain.  Reports that for the last 1 week she has had left arm pain that shoots up her arm into the left side of her neck worse with movement.  Reports that for the last 2 days she has had no appetite and associated left-sided chest pain that does not radiate and is not associated with shortness of breath.  She is also endorsing lightheadedness, dizziness, weakness as well as diarrhea.  She denies abdominal pain.  Denies a history of chest pain.  States that she believes it could possibly be secondary to her GERD however has been taking her prescribed PPI without relief.  Reports that last night her chest pain was so severe that she could not sleep all night.  Denies leg swelling, fevers.   Chest Pain Associated symptoms: dizziness and weakness   Associated symptoms: no fever and no shortness of breath        Home Medications Prior to Admission medications   Medication Sig Start Date End Date Taking? Authorizing Provider  cyclobenzaprine (FLEXERIL) 10 MG tablet Take 1 tablet (10 mg total) by mouth 2 (two) times daily as needed for muscle spasms. 08/02/23  Yes Al Decant, PA-C  amphetamine-dextroamphetamine (ADDERALL XR) 30 MG 24 hr capsule Take 30 mg by mouth every morning.    [provider]  amphetamine-dextroamphetamine (ADDERALL) 10 MG tablet Take 15 mg by mouth every evening.    [provider]  HYDROcodone-acetaminophen (NORCO/VICODIN) 5-325 MG tablet Take 2 tablets by mouth every 4 (four) hours as needed. 12/22/20   Rolan Bucco, MD  liraglutide (VICTOZA) 18 MG/3ML SOPN Inject 0.6 mg into the skin daily.     [provider]  losartan (COZAAR) 50 MG tablet Take 100 mg by mouth daily.    [provider]  metoprolol (TOPROL-XL) 200 MG 24 hr tablet Take 200 mg by mouth daily.    [provider]  omeprazole (PRILOSEC) 20 MG capsule Take 20 mg by mouth as needed (heartburn).    [provider]  pantoprazole (PROTONIX) 20 MG tablet Take 1 tablet (20 mg total) by mouth daily. 12/22/20   Rolan Bucco, MD      Allergies    Nsaids and Vicodin hp [hydrocodone-acetaminophen]    Review of Systems   Review of Systems  Constitutional:  Negative for fever.  Respiratory:  Negative for shortness of breath.   Cardiovascular:  Positive for chest pain.  Musculoskeletal:  Positive for myalgias.  Neurological:  Positive for dizziness, weakness and light-headedness.    Physical Exam Updated Vital Signs BP 127/89   Pulse 77   Temp 98.6 F (37 C) (Oral)   Resp 17   Ht 5\' 4"  (1.626 m)   Wt 95.3 kg   SpO2 100%   BMI 36.05 kg/m  Physical Exam Vitals and nursing note reviewed.  Constitutional:      General: She is not in acute distress.    Appearance: Normal appearance. She is not ill-appearing, toxic-appearing or diaphoretic.  HENT:     Head: Normocephalic and atraumatic.     Nose: Nose normal.  Mouth/Throat:     Mouth: Mucous membranes are moist.     Pharynx: Oropharynx is clear.  Eyes:     Extraocular Movements: Extraocular movements intact.     Conjunctiva/sclera: Conjunctivae normal.     Pupils: Pupils are equal, round, and reactive to light.  Neck:   Cardiovascular:     Rate and Rhythm: Normal rate and regular rhythm.  Pulmonary:     Effort: Pulmonary effort is normal.     Breath sounds: Normal breath sounds. No wheezing.  Chest:     Chest wall: Tenderness present.     Comments: Reproducible left-sided chest wall tenderness Abdominal:     General: Abdomen is flat. Bowel sounds are normal.     Palpations: Abdomen is soft.     Tenderness: There is  no abdominal tenderness.  Musculoskeletal:     Cervical back: Normal range of motion and neck supple. No tenderness.  Skin:    General: Skin is warm and dry.     Capillary Refill: Capillary refill takes less than 2 seconds.  Neurological:     Mental Status: She is alert and oriented to person, place, and time.     ED Results / Procedures / Treatments   Labs (all labs ordered are listed, but only abnormal results are displayed) Labs Reviewed  BASIC METABOLIC PANEL  CBC  HCG, SERUM, QUALITATIVE  TROPONIN I (HIGH SENSITIVITY)  TROPONIN I (HIGH SENSITIVITY)    EKG EKG Interpretation Date/Time:  Tuesday August 02 2023 18:33:22 EST Ventricular Rate:  98 PR Interval:  120 QRS Duration:  120 QT Interval:  370 QTC Calculation: 473 R Axis:   30  Text Interpretation: Sinus rhythm Nonspecific intraventricular conduction delay Confirmed by Gerhard Munch [phone]) on 08/02/2023 7:39:28 PM  Radiology No results found.  Procedures Procedures   Medications Ordered in ED Medications  alum & mag hydroxide-simeth (MAALOX/MYLANTA) 200-200-20 MG/5ML suspension 30 mL (30 mLs Oral Given 08/02/23 1949)  morphine (PF) 4 MG/ML injection 4 mg (4 mg Intravenous Given 08/02/23 2030)  ondansetron (ZOFRAN) injection 4 mg (4 mg Intravenous Given 08/02/23 2046)    ED Course/ Medical Decision Making/ A&P  Medical Decision Making Amount and/or Complexity of Data Reviewed Labs: ordered. Radiology: ordered.  Risk OTC drugs. Prescription drug management.   52 year old female presents to the ED for evaluation.  Please see HPI for further details.  On examination patient is afebrile and nontachycardic.  Lung sounds are clear bilaterally and she is not hypoxic.  Her abdomen is soft and compressible throughout.  Neurological examination is at baseline with 5 out of 5 strength of upper extremities bilaterally.  She does have reproducible chest wall tenderness as well as left-sided paracervical  spinal tenderness.  No centralized cervical spinal tenderness.  Initially given GI cocktail however patient reports that pain is continuing.  Will provide 4 mg of morphine and reassess.  CBC is without leukocytosis or anemia.  Metabolic panel unremarkable.  Troponin 3, delta 3.  Chest x-ray unremarkable.  EKG is nonischemic with normal sinus rhythm.  Patient's PERC criteria negative.  No recent surgery or travel, unilateral leg swelling, hemoptysis, history of DVT or PE, exogenous hormone use.  Patient is not hypoxic.  Patient states that the pain is traveling up her left arm towards her left shoulder.  She states that the pain is worse with movement.  She has reproducible chest wall tenderness on palpation.  I suspect the patient's pain is secondary to a musculoskeletal cause.  Her workup is reassuring  with 2 negative troponins.  Patient reports pain well-controlled at this time.  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  Will advise her to follow-up with her PCP.  Return precautions provided and she voiced understanding.  Stable to discharge.  Final Clinical Impression(s) / ED Diagnoses Final diagnoses:  Chest pain, unspecified type    Rx / DC Orders ED Discharge Orders          Ordered    cyclobenzaprine (FLEXERIL) 10 MG tablet  2 times daily PRN        08/02/23 2137              Al Decant, PA-C 08/02/23 2140    Gerhard Munch, MD 08/02/23 2213

## 2023-10-03 ENCOUNTER — Other Ambulatory Visit: Payer: Self-pay

## 2023-10-03 ENCOUNTER — Emergency Department (HOSPITAL_COMMUNITY)
Admission: EM | Admit: 2023-10-03 | Discharge: 2023-10-03 | Disposition: A | Discharge status: Home / Self Care | Payer: Self-pay | Attending: Emergency Medicine | Admitting: Emergency Medicine

## 2023-10-03 ENCOUNTER — Encounter (HOSPITAL_COMMUNITY): Payer: Self-pay

## 2023-10-03 DIAGNOSIS — K2900 Acute gastritis without bleeding: Secondary | ICD-10-CM | POA: Insufficient documentation

## 2023-10-03 DIAGNOSIS — F1290 Cannabis use, unspecified, uncomplicated: Secondary | ICD-10-CM | POA: Insufficient documentation

## 2023-10-03 DIAGNOSIS — J101 Influenza due to other identified influenza virus with other respiratory manifestations: Secondary | ICD-10-CM | POA: Insufficient documentation

## 2023-10-03 DIAGNOSIS — I10 Essential (primary) hypertension: Secondary | ICD-10-CM | POA: Insufficient documentation

## 2023-10-03 DIAGNOSIS — Z20822 Contact with and (suspected) exposure to covid-19: Secondary | ICD-10-CM | POA: Insufficient documentation

## 2023-10-03 LAB — CBC WITH DIFFERENTIAL/PLATELET
Abs Immature Granulocytes: 0.01 10*3/uL (ref 0.00–0.07)
Basophils Absolute: 0 10*3/uL (ref 0.0–0.1)
Basophils Relative: 0 %
Eosinophils Absolute: 0 10*3/uL (ref 0.0–0.5)
Eosinophils Relative: 1 %
HCT: 43.5 % (ref 36.0–46.0)
Hemoglobin: 14.3 g/dL (ref 12.0–15.0)
Immature Granulocytes: 0 %
Lymphocytes Relative: 42 %
Lymphs Abs: 2.4 10*3/uL (ref 0.7–4.0)
MCH: 32.5 pg (ref 26.0–34.0)
MCHC: 32.9 g/dL (ref 30.0–36.0)
MCV: 98.9 fL (ref 80.0–100.0)
Monocytes Absolute: 0.4 10*3/uL (ref 0.1–1.0)
Monocytes Relative: 7 %
Neutro Abs: 2.9 10*3/uL (ref 1.7–7.7)
Neutrophils Relative %: 50 %
Platelets: 252 10*3/uL (ref 150–400)
RBC: 4.4 MIL/uL (ref 3.87–5.11)
RDW: 12.3 % (ref 11.5–15.5)
WBC: 5.8 10*3/uL (ref 4.0–10.5)
nRBC: 0 % (ref 0.0–0.2)

## 2023-10-03 LAB — URINALYSIS, ROUTINE W REFLEX MICROSCOPIC
Bilirubin Urine: NEGATIVE
Glucose, UA: NEGATIVE mg/dL
Hgb urine dipstick: NEGATIVE
Ketones, ur: NEGATIVE mg/dL
Nitrite: NEGATIVE
Protein, ur: NEGATIVE mg/dL
Specific Gravity, Urine: 1.025 (ref 1.005–1.030)
pH: 6 (ref 5.0–8.0)

## 2023-10-03 LAB — RESP PANEL BY RT-PCR (RSV, FLU A&B, COVID)  RVPGX2
Influenza A by PCR: POSITIVE — AB
Influenza B by PCR: NEGATIVE
Resp Syncytial Virus by PCR: NEGATIVE
SARS Coronavirus 2 by RT PCR: NEGATIVE

## 2023-10-03 LAB — RAPID URINE DRUG SCREEN, HOSP PERFORMED
Amphetamines: NOT DETECTED
Barbiturates: NOT DETECTED
Benzodiazepines: NOT DETECTED
Cocaine: NOT DETECTED
Opiates: NOT DETECTED
Tetrahydrocannabinol: POSITIVE — AB

## 2023-10-03 LAB — COMPREHENSIVE METABOLIC PANEL
ALT: 30 U/L (ref 0–44)
AST: 23 U/L (ref 15–41)
Albumin: 3.9 g/dL (ref 3.5–5.0)
Alkaline Phosphatase: 70 U/L (ref 38–126)
Anion gap: 9 (ref 5–15)
BUN: 11 mg/dL (ref 6–20)
CO2: 26 mmol/L (ref 22–32)
Calcium: 8.7 mg/dL — ABNORMAL LOW (ref 8.9–10.3)
Chloride: 106 mmol/L (ref 98–111)
Creatinine, Ser: 0.84 mg/dL (ref 0.44–1.00)
GFR, Estimated: >60 mL/min (ref >60)
Glucose, Bld: 110 mg/dL — ABNORMAL HIGH (ref 70–99)
Potassium: 3.9 mmol/L (ref 3.5–5.1)
Sodium: 141 mmol/L (ref 135–145)
Total Bilirubin: 0.7 mg/dL (ref 0.0–1.2)
Total Protein: 7.7 g/dL (ref 6.5–8.1)

## 2023-10-03 LAB — HCG, SERUM, QUALITATIVE: Preg, Serum: NEGATIVE

## 2023-10-03 LAB — LIPASE, BLOOD: Lipase: 40 U/L (ref 11–51)

## 2023-10-03 MED ORDER — OSELTAMIVIR PHOSPHATE 75 MG PO CAPS: 75.0000 mg | ORAL_CAPSULE | Freq: Two times a day (BID) | ORAL | 0 refills | Status: AC

## 2023-10-03 MED ORDER — FAMOTIDINE 20 MG PO TABS
20.0000 mg | ORAL_TABLET | Freq: Once | ORAL | Status: AC
  Administered 2023-10-03: 20 mg via ORAL
  Filled 2023-10-03: qty 1

## 2023-10-03 MED ORDER — SODIUM CHLORIDE 0.9 % IV BOLUS
1000.0000 mL | Freq: Once | INTRAVENOUS | Status: AC
  Administered 2023-10-03: 1000 mL via INTRAVENOUS

## 2023-10-03 MED ORDER — LIDOCAINE VISCOUS HCL 2 % MT SOLN
15.0000 mL | Freq: Once | OROMUCOSAL | Status: AC
  Administered 2023-10-03: 15 mL via ORAL
  Filled 2023-10-03: qty 15

## 2023-10-03 MED ORDER — ONDANSETRON 4 MG PO TBDP: 4.0000 mg | ORAL_TABLET | Freq: Three times a day (TID) | ORAL | 0 refills | Status: AC | PRN

## 2023-10-03 MED ORDER — DROPERIDOL 2.5 MG/ML IJ SOLN
2.5000 mg | Freq: Once | INTRAMUSCULAR | Status: AC
  Administered 2023-10-03: 2.5 mg via INTRAVENOUS
  Filled 2023-10-03: qty 2

## 2023-10-03 MED ORDER — ALUM & MAG HYDROXIDE-SIMETH 200-200-20 MG/5ML PO SUSP
30.0000 mL | Freq: Once | ORAL | Status: AC
  Administered 2023-10-03: 30 mL via ORAL
  Filled 2023-10-03: qty 30

## 2023-10-03 MED ORDER — FENTANYL CITRATE PF 50 MCG/ML IJ SOSY
50.0000 ug | PREFILLED_SYRINGE | Freq: Once | INTRAMUSCULAR | Status: AC
  Administered 2023-10-03: 50 ug via INTRAVENOUS
  Filled 2023-10-03: qty 1

## 2023-10-03 MED ORDER — FAMOTIDINE 20 MG PO TABS: 20.0000 mg | ORAL_TABLET | Freq: Two times a day (BID) | ORAL | 0 refills | Status: AC

## 2023-10-03 MED ORDER — OSELTAMIVIR PHOSPHATE 75 MG PO CAPS
75.0000 mg | ORAL_CAPSULE | Freq: Once | ORAL | Status: AC
  Administered 2023-10-03: 75 mg via ORAL
  Filled 2023-10-03: qty 1

## 2023-10-03 NOTE — ED Provider Notes (Signed)
Jaclyn [name]   CSN: [id number] Arrival date & time: 10/03/23  1126     History  Chief Complaint  Patient presents with   Fever   Generalized Body Aches   Abdominal Pain    Jaclyn [name] is a 53 y.o. female.   Fever Associated symptoms: diarrhea, nausea and vomiting   Abdominal Pain Associated symptoms: diarrhea, fever, nausea and vomiting      53 year old female with medical history significant for GERD, hypertension, cannabis use who presents to the Emergency Department with generalized abdominal pain, worse in the left upper quadrant, fevers with a Tmax of 102-103, vomiting, diarrhea and cough since this past Saturday.  The patient denies any sick contacts.  She has been able to keep anything down including her home antihypertensives due to ongoing vomiting.  She denies any hematemesis or coffee-ground emesis.  She denies any hematochezia or melena.  She states that her pain in her abdomen is located in the left upper quadrant.  Home Medications Prior to Admission medications   Medication Sig Start Date End Date Taking? Authorizing Provider  famotidine (PEPCID) 20 MG tablet Take 1 tablet (20 mg total) by mouth 2 (two) times daily. 10/03/23  Yes Ernie Avena, MD  ondansetron (ZOFRAN-ODT) 4 MG disintegrating tablet Take 1 tablet (4 mg total) by mouth every 8 (eight) hours as needed for nausea or vomiting. 10/03/23  Yes Ernie Avena, MD  oseltamivir (TAMIFLU) 75 MG capsule Take 1 capsule (75 mg total) by mouth every 12 (twelve) hours. 10/03/23  Yes Ernie Avena, MD  amphetamine-dextroamphetamine (ADDERALL XR) 30 MG 24 hr capsule Take 30 mg by mouth every morning.    [provider]  amphetamine-dextroamphetamine (ADDERALL) 10 MG tablet Take 15 mg by mouth every evening.    [provider]  cyclobenzaprine (FLEXERIL) 10 MG tablet Take 1 tablet (10 mg total) by mouth 2 (two) times daily as needed for  muscle spasms. 08/02/23   Al Decant, PA-C  HYDROcodone-acetaminophen (NORCO/VICODIN) 5-325 MG tablet Take 2 tablets by mouth every 4 (four) hours as needed. 12/22/20   Rolan Bucco, MD  liraglutide (VICTOZA) 18 MG/3ML SOPN Inject 0.6 mg into the skin daily.    [provider]  losartan (COZAAR) 50 MG tablet Take 100 mg by mouth daily.    [provider]  metoprolol (TOPROL-XL) 200 MG 24 hr tablet Take 200 mg by mouth daily.    [provider]  omeprazole (PRILOSEC) 20 MG capsule Take 20 mg by mouth as needed (heartburn).    [provider]  pantoprazole (PROTONIX) 20 MG tablet Take 1 tablet (20 mg total) by mouth daily. 12/22/20   Rolan Bucco, MD      Allergies    Nsaids and Vicodin hp [hydrocodone-acetaminophen]    Review of Systems   Review of Systems  Constitutional:  Positive for fever.  Gastrointestinal:  Positive for abdominal pain, diarrhea, nausea and vomiting.  All other systems reviewed and are negative.   Physical Exam Updated Vital Signs BP (!) 157/104 (BP Location: Left Arm)   Pulse 70   Temp 98.1 F (36.7 C) (Oral)   Resp 18   Ht 5\' 4"  (1.626 m)   Wt 95.3 kg   SpO2 100%   BMI 36.06 kg/m  Physical Exam Vitals and nursing [name].  Constitutional:      General: She is not in acute distress.    Appearance: She is well-developed.  HENT:  Head: Normocephalic and atraumatic.  Eyes:     Conjunctiva/sclera: Conjunctivae normal.  Cardiovascular:     Rate and Rhythm: Normal rate and regular rhythm.     Heart sounds: No murmur heard. Pulmonary:     Effort: Pulmonary effort is normal. No respiratory distress.     Breath sounds: Normal breath sounds.  Abdominal:     Palpations: Abdomen is soft.     Tenderness: There is abdominal tenderness in the left upper quadrant.  Musculoskeletal:        General: No swelling.     Cervical back: Neck supple.  Skin:    General: Skin is warm and dry.     Capillary  Refill: Capillary refill takes less than 2 seconds.  Neurological:     Mental Status: She is alert.  Psychiatric:        Mood and Affect: Mood normal.     ED Results / Procedures / Treatments   Labs (all labs ordered are listed, but only abnormal results are displayed) Labs Reviewed  RESP PANEL BY RT-PCR (RSV, FLU A&B, COVID)  RVPGX2 - Abnormal; Notable for the following components:      Result Value   Influenza A by PCR POSITIVE (*)    All other components within normal limits  COMPREHENSIVE METABOLIC PANEL - Abnormal; Notable for the following components:   Glucose, Bld 110 (*)    Calcium 8.7 (*)    All other components within normal limits  URINALYSIS, ROUTINE W REFLEX MICROSCOPIC - Abnormal; Notable for the following components:   APPearance CLOUDY (*)    Leukocytes,Ua TRACE (*)    Bacteria, UA RARE (*)    All other components within normal limits  LIPASE, BLOOD  CBC WITH DIFFERENTIAL/PLATELET  HCG, SERUM, QUALITATIVE  RAPID URINE DRUG SCREEN, HOSP PERFORMED    EKG EKG Interpretation Date/Time:  Monday October 03 2023 12:24:21 EST Ventricular Rate:  64 PR Interval:  135 QRS Duration:  82 QT Interval:  403 QTC Calculation: 416 R Axis:   60  Text Interpretation: Sinus rhythm Confirmed by Ernie Avena ([id number]) on 10/03/2023 1:44:23 PM  Radiology No results found.  Procedures Procedures    Medications Ordered in ED Medications  sodium chloride 0.9 % bolus 1,000 mL (1,000 mLs Intravenous New Bag/Given 10/03/23 1236)  droperidol (INAPSINE) 2.5 MG/ML injection 2.5 mg (2.5 mg Intravenous Given 10/03/23 1231)  fentaNYL (SUBLIMAZE) injection 50 mcg (50 mcg Intravenous Given 10/03/23 1234)  alum & mag hydroxide-simeth (MAALOX/MYLANTA) 200-200-20 MG/5ML suspension 30 mL (30 mLs Oral Given 10/03/23 1302)    And  lidocaine (XYLOCAINE) 2 % viscous mouth solution 15 mL (15 mLs Oral Given 10/03/23 1302)  famotidine (PEPCID) tablet 20 mg (20 mg Oral Given 10/03/23 1302)   oseltamivir (TAMIFLU) capsule 75 mg (75 mg Oral Given 10/03/23 1302)    ED Course/ Medical Decision Making/ A&P Clinical Course as of 10/03/23 1346  Mon Oct 03, 2023  1242 Influenza A By PCR(!): POSITIVE [JL]    Clinical Course User Index [JL] Ernie Avena, MD                                 Medical Decision Making Amount and/or Complexity of Data Reviewed Labs: ordered. Decision-making details documented in ED Course.  Risk OTC drugs. Prescription drug management.    53 year old female with medical history significant for GERD, hypertension, cannabis use who presents to the Emergency Department with generalized abdominal pain,  worse in the left upper quadrant, fevers with a Tmax of 102-103, vomiting, diarrhea and cough since this past Saturday.  The patient denies any sick contacts.  She has been able to keep anything down including her home antihypertensives due to ongoing vomiting.  She denies any hematemesis or coffee-ground emesis.  She denies any hematochezia or melena.  She states that her pain in her abdomen is located in the left upper quadrant.  On arrival, the patient was afebrile, not tachycardic or tachypneic, hemodynamically stable, saturating on room air.  Physical exam revealed left upper quadrant tenderness to palpation.  Suspect viral syndrome with gastroenteritis and potentially gastritis.  Low concern for acute GI bleed.  Low concern for other acute intra-abdominal abnormality based on patient presentation and exam.  IV access obtained and the patient was administered an IV fluid bolus for volume resuscitation, IV droperidol for nausea, EKG was obtained which revealed normal QTc.  Received IV fentanyl for pain control.  She subsequent was tolerating oral intake and tolerated a GI cocktail and oral Pepcid.  She is feeling symptomatically improved following these interventions.  Laboratory evaluation revealed hCG negative, COVID-19 influenza, RSV PCR testing collected  and positive for influenza A, urinalysis negative for UTI, UDS collected and pending, hCG negative, lipase normal, CBC without a leukocytosis or anemia, CMP without electrolyte abnormality, normal renal and liver function.  On repeat assessment, the patient was symptomatically improved, tolerating oral intake.  Informed of the diagnosis of influenza with likely gastritis, will discharge on a course of Tamiflu, Zofran prescribed for nausea, advised continued supportive care, over-the-counter Maalox, return precautions provided for any severe worsening symptoms.  Final Clinical Impression(s) / ED Diagnoses Final diagnoses:  Influenza A  Acute gastritis without hemorrhage, unspecified gastritis type    Rx / DC Orders ED Discharge Orders          Ordered    oseltamivir (TAMIFLU) 75 MG capsule  Every 12 hours        10/03/23 1342    famotidine (PEPCID) 20 MG tablet  2 times daily        10/03/23 1342    ondansetron (ZOFRAN-ODT) 4 MG disintegrating tablet  Every 8 hours PRN        10/03/23 1343              Ernie Avena, MD 10/03/23 1346

## 2023-10-03 NOTE — Discharge Instructions (Addendum)
You tested positive for influenza.  You are tolerating oral intake, recommend continued symptomatic management.  Some of your abdominal discomfort also is likely consistent with gastritis in the setting of your vomiting.  Return for any severe worsening symptoms as you could benefit from further diagnostic testing with CT imaging if you have worsening severe abdominal pain.  Try over-the-counter Maalox, oral Pepcid has been prescribed as well.  Continue symptomatic supportive care for influenza infection to include Tylenol for fever control, Zofran has been prescribed for nausea, continue oral fluid resuscitation, Tamiflu is been prescribed as well.

## 2023-10-03 NOTE — ED Triage Notes (Signed)
Pt reports with abdominal pain, fevers, vomiting, diarrhea, and cough since Saturday. Pt reports that she has been unable to keep down her htn medication due to vomiting.

## 2024-09-11 ENCOUNTER — Emergency Department (HOSPITAL_COMMUNITY): Payer: Self-pay

## 2024-09-11 ENCOUNTER — Emergency Department (HOSPITAL_COMMUNITY): Admission: EM | Admit: 2024-09-11 | Discharge: 2024-09-11 | Discharge status: Against Medical Advice | Payer: Self-pay

## 2024-09-11 DIAGNOSIS — Z5321 Procedure and treatment not carried out due to patient leaving prior to being seen by health care provider: Secondary | ICD-10-CM | POA: Diagnosis not present

## 2024-09-11 DIAGNOSIS — M7989 Other specified soft tissue disorders: Secondary | ICD-10-CM | POA: Diagnosis not present

## 2024-09-11 DIAGNOSIS — R519 Headache, unspecified: Secondary | ICD-10-CM | POA: Insufficient documentation

## 2024-09-11 DIAGNOSIS — Y900 Blood alcohol level of less than 20 mg/100 ml: Secondary | ICD-10-CM | POA: Diagnosis not present

## 2024-09-11 DIAGNOSIS — R110 Nausea: Secondary | ICD-10-CM | POA: Diagnosis not present

## 2024-09-11 DIAGNOSIS — M25571 Pain in right ankle and joints of right foot: Secondary | ICD-10-CM | POA: Diagnosis present

## 2024-09-11 DIAGNOSIS — Y92410 Unspecified street and highway as the place of occurrence of the external cause: Secondary | ICD-10-CM | POA: Diagnosis not present

## 2024-09-11 DIAGNOSIS — M542 Cervicalgia: Secondary | ICD-10-CM | POA: Diagnosis not present

## 2024-09-11 LAB — PROTIME-INR
INR: 1 (ref 0.8–1.2)
Prothrombin Time: 13.8 s (ref 11.4–15.2)

## 2024-09-11 LAB — COMPREHENSIVE METABOLIC PANEL WITH GFR
ALT: 13 U/L (ref 0–44)
AST: 17 U/L (ref 15–41)
Albumin: 4.7 g/dL (ref 3.5–5.0)
Alkaline Phosphatase: 110 U/L (ref 38–126)
Anion gap: 10 (ref 5–15)
BUN: 17 mg/dL (ref 6–20)
CO2: 26 mmol/L (ref 22–32)
Calcium: 9.8 mg/dL (ref 8.9–10.3)
Chloride: 105 mmol/L (ref 98–111)
Creatinine, Ser: 0.84 mg/dL (ref 0.44–1.00)
GFR, Estimated: >60 mL/min
Glucose, Bld: 110 mg/dL — ABNORMAL HIGH (ref 70–99)
Potassium: 4.1 mmol/L (ref 3.5–5.1)
Sodium: 141 mmol/L (ref 135–145)
Total Bilirubin: 1.2 mg/dL (ref 0.0–1.2)
Total Protein: 8.3 g/dL — ABNORMAL HIGH (ref 6.5–8.1)

## 2024-09-11 LAB — I-STAT CHEM 8, ED
BUN: 18 mg/dL (ref 6–20)
Calcium, Ion: 1.2 mmol/L (ref 1.15–1.40)
Chloride: 106 mmol/L (ref 98–111)
Creatinine, Ser: 0.9 mg/dL (ref 0.44–1.00)
Glucose, Bld: 101 mg/dL — ABNORMAL HIGH (ref 70–99)
HCT: 44 % (ref 36.0–46.0)
Hemoglobin: 15 g/dL (ref 12.0–15.0)
Potassium: 4 mmol/L (ref 3.5–5.1)
Sodium: 142 mmol/L (ref 135–145)
TCO2: 26 mmol/L (ref 22–32)

## 2024-09-11 LAB — CBC
HCT: 44.2 % (ref 36.0–46.0)
Hemoglobin: 14.8 g/dL (ref 12.0–15.0)
MCH: 31.9 pg (ref 26.0–34.0)
MCHC: 33.5 g/dL (ref 30.0–36.0)
MCV: 95.3 fL (ref 80.0–100.0)
Platelets: 303 K/uL (ref 150–400)
RBC: 4.64 MIL/uL (ref 3.87–5.11)
RDW: 12.3 % (ref 11.5–15.5)
WBC: 13.3 K/uL — ABNORMAL HIGH (ref 4.0–10.5)
nRBC: 0 % (ref 0.0–0.2)

## 2024-09-11 LAB — ETHANOL: Alcohol, Ethyl (B): <15 mg/dL

## 2024-09-11 LAB — I-STAT CG4 LACTIC ACID, ED: Lactic Acid, Venous: 0.6 mmol/L (ref 0.5–1.9)

## 2024-09-11 MED ORDER — SODIUM CHLORIDE 0.9% FLUSH: 3.0000 mL | INTRAVENOUS | Status: DC | PRN

## 2024-09-11 MED ORDER — SODIUM CHLORIDE 0.9 % IV SOLN: 250.0000 mL | INTRAVENOUS | Status: DC | PRN

## 2024-09-11 MED ORDER — ONDANSETRON HCL 4 MG/2ML IJ SOLN
4.0000 mg | Freq: Once | INTRAMUSCULAR | Status: AC
  Administered 2024-09-11: 4 mg via INTRAVENOUS
  Filled 2024-09-11: qty 2

## 2024-09-11 MED ORDER — FENTANYL CITRATE (PF) 50 MCG/ML IJ SOSY
50.0000 ug | PREFILLED_SYRINGE | Freq: Once | INTRAMUSCULAR | Status: AC
  Administered 2024-09-11: 50 ug via INTRAVENOUS
  Filled 2024-09-11: qty 1

## 2024-09-11 MED ORDER — SODIUM CHLORIDE 0.9% FLUSH: 3.0000 mL | Freq: Two times a day (BID) | INTRAVENOUS | Status: DC
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
